# Patient Record
Sex: Male | Born: 1990 | Race: White | Hispanic: No | Marital: Single | Smoking: Current every day smoker
Health system: Southern US, Community
[De-identification: ages and names within clinical notes are randomized; demographics above are authoritative.]

---
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
## 2013-05-04 ENCOUNTER — Other Ambulatory Visit (HOSPITAL_COMMUNITY): Payer: Self-pay | Admitting: Orthopedic Surgery

## 2013-05-04 DIAGNOSIS — M545 Low back pain: Secondary | ICD-10-CM

## 2013-05-09 ENCOUNTER — Ambulatory Visit (HOSPITAL_COMMUNITY)
Admission: RE | Admit: 2013-05-09 | Discharge: 2013-05-09 | Disposition: A | Discharge status: Home / Self Care | Payer: BC Managed Care – PPO | Source: Ambulatory Visit | Attending: Orthopedic Surgery | Admitting: Orthopedic Surgery

## 2013-05-09 DIAGNOSIS — IMO0001 Reserved for inherently not codable concepts without codable children: Secondary | ICD-10-CM | POA: Insufficient documentation

## 2013-05-09 DIAGNOSIS — M545 Low back pain, unspecified: Secondary | ICD-10-CM | POA: Insufficient documentation

## 2013-05-09 DIAGNOSIS — M5126 Other intervertebral disc displacement, lumbar region: Secondary | ICD-10-CM | POA: Insufficient documentation

## 2013-05-09 DIAGNOSIS — M48061 Spinal stenosis, lumbar region without neurogenic claudication: Secondary | ICD-10-CM | POA: Insufficient documentation

## 2013-05-17 ENCOUNTER — Other Ambulatory Visit: Payer: Self-pay | Admitting: Orthopedic Surgery

## 2013-05-17 DIAGNOSIS — M549 Dorsalgia, unspecified: Secondary | ICD-10-CM

## 2013-05-25 ENCOUNTER — Ambulatory Visit
Admission: RE | Admit: 2013-05-25 | Discharge: 2013-05-25 | Disposition: A | Discharge status: Home / Self Care | Payer: BC Managed Care – PPO | Source: Ambulatory Visit | Attending: Orthopedic Surgery | Admitting: Orthopedic Surgery

## 2013-05-25 VITALS — BP 144/72 | HR 62

## 2013-05-25 DIAGNOSIS — M549 Dorsalgia, unspecified: Secondary | ICD-10-CM

## 2013-05-25 MED ORDER — METHYLPREDNISOLONE ACETATE 40 MG/ML INJ SUSP (RADIOLOG
120.0000 mg | Freq: Once | INTRAMUSCULAR | Status: AC
  Administered 2013-05-25: 120 mg via EPIDURAL

## 2013-05-25 MED ORDER — IOHEXOL 180 MG/ML  SOLN
1.0000 mL | Freq: Once | INTRAMUSCULAR | Status: AC | PRN
  Administered 2013-05-25: 1 mL via EPIDURAL

## 2017-10-20 DIAGNOSIS — K029 Dental caries, unspecified: Secondary | ICD-10-CM | POA: Insufficient documentation

## 2017-10-20 DIAGNOSIS — F17210 Nicotine dependence, cigarettes, uncomplicated: Secondary | ICD-10-CM | POA: Insufficient documentation

## 2017-10-21 ENCOUNTER — Encounter (HOSPITAL_COMMUNITY): Payer: Self-pay

## 2017-10-21 ENCOUNTER — Other Ambulatory Visit: Payer: Self-pay

## 2017-10-21 ENCOUNTER — Emergency Department (HOSPITAL_COMMUNITY)
Admission: EM | Admit: 2017-10-21 | Discharge: 2017-10-21 | Disposition: A | Discharge status: Home / Self Care | Payer: Self-pay | Attending: Emergency Medicine | Admitting: Emergency Medicine

## 2017-10-21 DIAGNOSIS — K029 Dental caries, unspecified: Secondary | ICD-10-CM

## 2017-10-21 MED ORDER — PENICILLIN V POTASSIUM 500 MG PO TABS: 500.0000 mg | ORAL_TABLET | Freq: Four times a day (QID) | ORAL | 0 refills | Status: AC

## 2017-10-21 MED ORDER — PENICILLIN V POTASSIUM 250 MG PO TABS
500.0000 mg | ORAL_TABLET | Freq: Once | ORAL | Status: AC
  Administered 2017-10-21: 500 mg via ORAL
  Filled 2017-10-21: qty 2

## 2017-10-21 MED ORDER — NAPROXEN 250 MG PO TABS
500.0000 mg | ORAL_TABLET | Freq: Once | ORAL | Status: AC
  Administered 2017-10-21: 500 mg via ORAL
  Filled 2017-10-21: qty 2

## 2017-10-21 NOTE — ED Provider Notes (Signed)
[facility] Provider Note   CSN: [id number] Arrival date & time: 10/20/17  2349     History   Chief Complaint Chief Complaint  Patient presents with  . Dental Pain    HPI Brad Tapia is a 27 y.o. male.  Patient states he has a history of "hereditary poor dentition".  Presents with a several day history of left-sided lower tooth pain over about the past 1 week.  Taking Tylenol without improvement.  Came in tonight because his wife made him.  Denies any bleeding or drainage.  Denies any difficulty breathing or difficulty swallowing.  No fever or vomiting. Patient feels that he might have an abscess.  He does not have a dentist.  Denies any other medical problems.   The history is provided by the patient.  Dental Pain      History reviewed. No pertinent past medical history.  There are no active problems to display for this patient.   History reviewed. No pertinent surgical history.     Home Medications    Prior to Admission medications   Not on File    Family History No family history on file.  Social History Social History   Tobacco Use  . Smoking status: Current Every Day Smoker    Packs/day: 1.00    Years: 4.00    Pack years: 4.00    Types: Cigarettes  . Smokeless tobacco: Never Used  Substance Use Topics  . Alcohol use: No    Frequency: Never  . Drug use: No     Allergies   Patient has no known allergies.   Review of Systems Review of Systems  Constitutional: Negative for appetite change and fever.  HENT: Positive for dental problem. Negative for drooling, sneezing, sore throat and trouble swallowing.   Eyes: Negative for visual disturbance.  Respiratory: Negative for cough and shortness of breath.   Cardiovascular: Negative for chest pain.  Gastrointestinal: Negative for abdominal pain, nausea and vomiting.  Genitourinary: Negative for dysuria and hematuria.  Musculoskeletal: Negative for arthralgias and myalgias.   Skin: Negative for rash.  Neurological: Negative for dizziness, weakness and headaches.   all other systems are negative except as noted in the HPI and PMH.     Physical Exam Updated Vital Signs BP (!) 124/98 (BP Location: Right Arm)   Pulse 67   Temp 97.9 F (36.6 C) (Oral)   Resp 14   Ht 5' 9.5" (1.765 m)   Wt 90.7 kg (200 lb)   SpO2 100%   BMI 29.11 kg/m   Physical Exam  Constitutional: He is oriented to person, place, and time. He appears well-developed and well-nourished. No distress.  HENT:  Head: Normocephalic and atraumatic.  Mouth/Throat: Oropharynx is clear and moist. No oropharyngeal exudate.  Very poor dentition throughout.  Multiple teeth broken off the gumline.  Floor of mouth is soft.  No Trismus. No abscess seen.  Tenderness to palpation of left lateral gumline without fluctuance or induration.  Floor of mouth is soft.  Eyes: Conjunctivae and EOM are normal. Pupils are equal, round, and reactive to light.  Neck: Normal range of motion. Neck supple.  No meningismus.  Cardiovascular: Normal rate, regular rhythm, normal heart sounds and intact distal pulses.  No murmur heard. Pulmonary/Chest: Effort normal and breath sounds normal. No respiratory distress.  Abdominal: Soft. There is no tenderness. There is no rebound and no guarding.  Musculoskeletal: Normal range of motion. He exhibits no edema or tenderness.  Neurological: He  is alert and oriented to person, place, and time. No cranial nerve deficit. He exhibits normal muscle tone. Coordination normal.  No ataxia on finger to nose bilaterally. No pronator drift. 5/5 strength throughout. CN 2-12 intact.Equal grip strength. Sensation intact.   Skin: Skin is warm.  Psychiatric: He has a normal mood and affect. His behavior is normal.  Nursing note and vitals reviewed.    ED Treatments / Results  Labs (all labs ordered are listed, but only abnormal results are displayed) Labs Reviewed - No data to  display  EKG  EKG Interpretation None       Radiology No results found.  Procedures Procedures (including critical care time)  Medications Ordered in ED Medications  penicillin v potassium (VEETID) tablet 500 mg (not administered)  naproxen (NAPROSYN) tablet 500 mg (not administered)     Initial Impression / Assessment and Plan / ED Course  I have reviewed the triage vital signs and the nursing notes.  Pertinent labs & imaging results that were available during my care of the patient were reviewed by me and considered in my medical decision making (see chart for details).     Patient with poor dentition throughout.  No evidence of abscess or Ludwig's angina.  Patient will be treated for multiple caries and needs dental follow-up.  Return precautions discussed    Final Clinical Impressions(s) / ED Diagnoses   Final diagnoses:  Dental caries    ED Discharge Orders    None       Madasyn Heath, Jeannett Senior, MD 10/21/17 [phone]

## 2017-10-21 NOTE — ED Triage Notes (Signed)
Left lower tooth pain with what he thinks is an abscess.

## 2019-01-11 ENCOUNTER — Emergency Department (HOSPITAL_COMMUNITY)
Admission: EM | Admit: 2019-01-11 | Discharge: 2019-01-12 | Disposition: A | Discharge status: Home / Self Care | Payer: Self-pay | Attending: Emergency Medicine | Admitting: Emergency Medicine

## 2019-01-11 ENCOUNTER — Other Ambulatory Visit: Payer: Self-pay

## 2019-01-11 ENCOUNTER — Encounter (HOSPITAL_COMMUNITY): Payer: Self-pay | Admitting: Emergency Medicine

## 2019-01-11 DIAGNOSIS — F17210 Nicotine dependence, cigarettes, uncomplicated: Secondary | ICD-10-CM | POA: Insufficient documentation

## 2019-01-11 DIAGNOSIS — H5789 Other specified disorders of eye and adnexa: Secondary | ICD-10-CM | POA: Insufficient documentation

## 2019-01-11 MED ORDER — ERYTHROMYCIN 5 MG/GM OP OINT
TOPICAL_OINTMENT | Freq: Once | OPHTHALMIC | Status: AC
  Administered 2019-01-12: 1 via OPHTHALMIC
  Filled 2019-01-11: qty 3.5

## 2019-01-11 MED ORDER — TETRACAINE HCL 0.5 % OP SOLN
1.0000 [drp] | Freq: Once | OPHTHALMIC | Status: AC
  Administered 2019-01-11: 1 [drp] via OPHTHALMIC
  Filled 2019-01-11: qty 4

## 2019-01-11 MED ORDER — FLUORESCEIN SODIUM 1 MG OP STRP
1.0000 | ORAL_STRIP | Freq: Once | OPHTHALMIC | Status: AC
  Administered 2019-01-11: 22:00:00 1 via OPHTHALMIC
  Filled 2019-01-11: qty 1
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
## 2019-01-11 NOTE — ED Triage Notes (Signed)
Patient complains of left eye irritation that started around 6 p.m. today. Patient states he was outside and feels like he may have something in his left eye. (dust) Eye is red and painful with burning and itching.

## 2019-02-28 DIAGNOSIS — N23 Unspecified renal colic: Secondary | ICD-10-CM | POA: Insufficient documentation

## 2019-02-28 DIAGNOSIS — N200 Calculus of kidney: Secondary | ICD-10-CM | POA: Insufficient documentation

## 2019-02-28 DIAGNOSIS — F17210 Nicotine dependence, cigarettes, uncomplicated: Secondary | ICD-10-CM | POA: Insufficient documentation

## 2019-03-01 ENCOUNTER — Emergency Department (HOSPITAL_COMMUNITY): Payer: Self-pay

## 2019-03-01 ENCOUNTER — Other Ambulatory Visit: Payer: Self-pay

## 2019-03-01 ENCOUNTER — Encounter (HOSPITAL_COMMUNITY): Payer: Self-pay

## 2019-03-01 ENCOUNTER — Emergency Department (HOSPITAL_COMMUNITY)
Admission: EM | Admit: 2019-03-01 | Discharge: 2019-03-01 | Disposition: A | Discharge status: Home / Self Care | Payer: Self-pay | Attending: Emergency Medicine | Admitting: Emergency Medicine

## 2019-03-01 DIAGNOSIS — N200 Calculus of kidney: Secondary | ICD-10-CM

## 2019-03-01 DIAGNOSIS — N23 Unspecified renal colic: Secondary | ICD-10-CM

## 2019-03-01 LAB — BASIC METABOLIC PANEL
Anion gap: 11 (ref 5–15)
BUN: 12 mg/dL (ref 6–20)
CO2: 24 mmol/L (ref 22–32)
Calcium: 9.1 mg/dL (ref 8.9–10.3)
Chloride: 105 mmol/L (ref 98–111)
Creatinine, Ser: 0.87 mg/dL (ref 0.61–1.24)
GFR calc Af Amer: >60 mL/min (ref >60)
GFR calc non Af Amer: >60 mL/min (ref >60)
Glucose, Bld: 128 mg/dL — ABNORMAL HIGH (ref 70–99)
Potassium: 4.4 mmol/L (ref 3.5–5.1)
Sodium: 140 mmol/L (ref 135–145)

## 2019-03-01 LAB — CBC WITH DIFFERENTIAL/PLATELET
Abs Immature Granulocytes: 0.07 10*3/uL (ref 0.00–0.07)
Basophils Absolute: 0 10*3/uL (ref 0.0–0.1)
Basophils Relative: 0 %
Eosinophils Absolute: 0 10*3/uL (ref 0.0–0.5)
Eosinophils Relative: 0 %
HCT: 46.2 % (ref 39.0–52.0)
Hemoglobin: 15.7 g/dL (ref 13.0–17.0)
Immature Granulocytes: 1 %
Lymphocytes Relative: 8 %
Lymphs Abs: 1.1 10*3/uL (ref 0.7–4.0)
MCH: 31.7 pg (ref 26.0–34.0)
MCHC: 34 g/dL (ref 30.0–36.0)
MCV: 93.1 fL (ref 80.0–100.0)
Monocytes Absolute: 0.4 10*3/uL (ref 0.1–1.0)
Monocytes Relative: 3 %
Neutro Abs: 11.8 10*3/uL — ABNORMAL HIGH (ref 1.7–7.7)
Neutrophils Relative %: 88 %
Platelets: 210 10*3/uL (ref 150–400)
RBC: 4.96 MIL/uL (ref 4.22–5.81)
RDW: 12.5 % (ref 11.5–15.5)
WBC: 13.4 10*3/uL — ABNORMAL HIGH (ref 4.0–10.5)
nRBC: 0 % (ref 0.0–0.2)

## 2019-03-01 LAB — URINALYSIS, ROUTINE W REFLEX MICROSCOPIC
Bacteria, UA: NONE SEEN
Bilirubin Urine: NEGATIVE
Glucose, UA: NEGATIVE mg/dL
Ketones, ur: NEGATIVE mg/dL
Leukocytes,Ua: NEGATIVE
Nitrite: NEGATIVE
Protein, ur: NEGATIVE mg/dL
RBC / HPF: >50 RBC/hpf — ABNORMAL HIGH (ref 0–5)
Specific Gravity, Urine: 1.021 (ref 1.005–1.030)
pH: 6 (ref 5.0–8.0)

## 2019-03-01 MED ORDER — IBUPROFEN 800 MG PO TABS: 800.0000 mg | ORAL_TABLET | Freq: Three times a day (TID) | ORAL | 0 refills | Status: AC

## 2019-03-01 MED ORDER — KETOROLAC TROMETHAMINE 30 MG/ML IJ SOLN
30.0000 mg | Freq: Once | INTRAMUSCULAR | Status: AC
  Administered 2019-03-01: 03:00:00 30 mg via INTRAVENOUS
  Filled 2019-03-01: qty 1

## 2019-03-01 MED ORDER — OXYCODONE-ACETAMINOPHEN 5-325 MG PO TABS: 1.0000 | ORAL_TABLET | ORAL | 0 refills | Status: AC | PRN

## 2019-03-01 MED ORDER — ONDANSETRON 4 MG PO TBDP: 4.0000 mg | ORAL_TABLET | Freq: Three times a day (TID) | ORAL | 0 refills | Status: AC | PRN

## 2019-03-01 MED ORDER — MORPHINE SULFATE (PF) 4 MG/ML IV SOLN
4.0000 mg | Freq: Once | INTRAVENOUS | Status: AC
Start: 2019-03-01 — End: 2019-03-01
  Administered 2019-03-01: 4 mg via INTRAVENOUS
  Filled 2019-03-01: qty 1

## 2019-03-01 MED ORDER — ONDANSETRON HCL 4 MG/2ML IJ SOLN
4.0000 mg | Freq: Once | INTRAMUSCULAR | Status: AC
  Administered 2019-03-01: 03:00:00 4 mg via INTRAVENOUS
  Filled 2019-03-01: qty 2

## 2019-03-01 NOTE — ED Triage Notes (Signed)
Pt arrived via POV c/o Rt Side Flank pain which began apprx 2000 last night. Pt states pain moves from back right side to front lower abdomen.

## 2019-03-01 NOTE — Discharge Instructions (Addendum)
Take the pain and nausea medication as prescribed.  Follow-up with the urologist.  Return to the ED with worsening pain, vomiting, inability urinate, fever or any other concerns.

## 2019-03-01 NOTE — ED Provider Notes (Signed)
[facility] Provider [facility]   CSN: [id number] Arrival date & time: 02/28/19  2344     History   Chief Complaint Chief Complaint  Patient presents with  . Flank Pain    HPI Brad Tapia is a 28 y.o. male.     Patient presents with right-sided flank pain and back pain that onset tonight around 8:00.  The pain radiates to his right abdomen and right testicle.  Is coming and going in waves.  He had one episode of vomiting at home.  He had some pain with urination or blood in the urine.  Denies any fevers or chills.  Denies any diarrhea.  Denies any chest pain or shortness of breath.  There is no radiation of the pain down his leg.  No bowel or bladder incontinence.  No fever.  He is concerned he could have a kidney stone.  He is never had one before.  No abdominal surgeries.  The history is provided by the patient.  Flank Pain Associated symptoms include abdominal pain. Pertinent negatives include no chest pain, no headaches and no shortness of breath.    History reviewed. No pertinent past medical history.  There are no active problems to display for this patient.   History reviewed. No pertinent surgical history.      Home Medications    Prior to Admission medications   Not on File    Family History History reviewed. No pertinent family history.  Social History Social History   Tobacco Use  . Smoking status: Current Every Day Smoker    Packs/day: 1.00    Years: 4.00    Pack years: 4.00    Types: Cigarettes  . Smokeless tobacco: Never Used  Substance Use Topics  . Alcohol use: No    Frequency: Never  . Drug use: No     Allergies   Patient has no known allergies.   Review of Systems Review of Systems  Constitutional: Negative for activity change, appetite change and fever.  HENT: Negative for congestion and rhinorrhea.   Respiratory: Negative for cough, chest tightness and shortness of breath.   Cardiovascular: Negative for  chest pain.  Gastrointestinal: Positive for abdominal pain, nausea and vomiting.  Genitourinary: Positive for dysuria, flank pain, hematuria, testicular pain and urgency.  Musculoskeletal: Positive for back pain. Negative for arthralgias and myalgias.  Skin: Negative for rash.  Neurological: Negative for dizziness, weakness and headaches.   all other systems are negative except as noted in the HPI and PMH.     Physical Exam Updated Vital Signs BP (!) 143/97 (BP Location: Left Arm)   Pulse 60   Temp 98.1 F (36.7 C) (Oral)   Resp 18   Ht 5\' 9"  (1.753 m)   Wt 99.8 kg   SpO2 100%   BMI 32.49 kg/m   Physical Exam Vitals signs and nursing note reviewed.  Constitutional:      General: He is not in acute distress.    Appearance: He is well-developed.     Comments: Resting comfortably  HENT:     Head: Normocephalic and atraumatic.     Mouth/Throat:     Pharynx: No oropharyngeal exudate.  Eyes:     Conjunctiva/sclera: Conjunctivae normal.     Pupils: Pupils are equal, round, and reactive to light.  Neck:     Musculoskeletal: Normal range of motion and neck supple.     Comments: No meningismus. Cardiovascular:     Rate and Rhythm: Normal rate  and regular rhythm.     Heart sounds: Normal heart sounds. No murmur.  Pulmonary:     Effort: Pulmonary effort is normal. No respiratory distress.     Breath sounds: Normal breath sounds.  Abdominal:     Palpations: Abdomen is soft.     Tenderness: There is no abdominal tenderness. There is no guarding or rebound.     Comments: Mild diffuse lower abdominal tenderness  Genitourinary:    Comments: No testicular tenderness Musculoskeletal: Normal range of motion.        General: No tenderness.     Comments: Right paraspinal CVA tenderness, no midline tenderness  Intact DP and PT pulses bilaterally  Skin:    General: Skin is warm.  Neurological:     Mental Status: He is alert and oriented to person, place, and time.     Cranial  Nerves: No cranial nerve deficit.     Motor: No abnormal muscle tone.     Coordination: Coordination normal.     Comments: No ataxia on finger to nose bilaterally. No pronator drift. 5/5 strength throughout. CN 2-12 intact.Equal grip strength. Sensation intact.   Psychiatric:        Behavior: Behavior normal.      ED Treatments / Results  Labs (all labs ordered are listed, but only abnormal results are displayed) Labs Reviewed  CBC WITH DIFFERENTIAL/PLATELET - Abnormal; Notable for the following components:      Result Value   WBC 13.4 (*)    Neutro Abs 11.8 (*)    All other components within normal limits  BASIC METABOLIC PANEL - Abnormal; Notable for the following components:   Glucose, Bld 128 (*)    All other components within normal limits  URINALYSIS, ROUTINE W REFLEX MICROSCOPIC - Abnormal; Notable for the following components:   APPearance HAZY (*)    Hgb urine dipstick LARGE (*)    RBC / HPF >50 (*)    All other components within normal limits    EKG    Radiology Ct Renal Stone Study  Result Date: 03/01/2019 CLINICAL DATA:  Right flank pain. EXAM: CT ABDOMEN AND PELVIS WITHOUT CONTRAST TECHNIQUE: Multidetector CT imaging of the abdomen and pelvis was performed following the standard protocol without IV contrast. COMPARISON:  None. FINDINGS: Lower chest: Breathing motion artifact. No focal airspace disease. No pleural fluid. Hepatobiliary: Decreased hepatic density consistent with steatosis. Focal fatty sparing adjacent to the gallbladder fossa. No focal hepatic abnormality on noncontrast exam. Pancreas: No ductal dilatation or inflammation. Spleen: Normal in size. Slight lobular contours superiorly. Adrenals/Urinary Tract: Normal adrenal glands. Obstructing 4 mm stone in the distal right ureter (at the level of the mid sacrum) with mild proximal hydroureteronephrosis. Minimal right perinephric edema. No additional nonobstructing right renal stones. There is a punctate  nonobstructing stone in the mid left kidney. No left hydronephrosis. Urinary bladder is partially distended. No bladder stone. Stomach/Bowel: Stomach is within normal limits. Appendix appears normal. No evidence of bowel wall thickening, distention, or inflammatory changes. Vascular/Lymphatic: A few prominent upper retroperitoneal nodes are likely reactive. Mild retroperitoneal edema in the upper abdomen likely reactive related to obstructive uropathy. Normal caliber abdominal aorta. No enlarged pelvic lymph nodes. Reproductive: Prostate is unremarkable. Other: Tiny fat containing umbilical hernia. No free air, free fluid, or intra-abdominal fluid collection. Musculoskeletal: There are no acute or suspicious osseous abnormalities. IMPRESSION: 1. Obstructing 4 mm stone in the distal right ureter with mild hydroureteronephrosis. 2. Punctate nonobstructing left nephrolithiasis. 3. Hepatic steatosis. Electronically Signed  By: Keith Rake M.D.   On: 03/01/2019 03:24    Procedures Procedures (including critical care time)  Medications Ordered in ED Medications  morphine 4 MG/ML injection 4 mg (has no administration in time range)  ondansetron (ZOFRAN) injection 4 mg (has no administration in time range)  ketorolac (TORADOL) 30 MG/ML injection 30 mg (has no administration in time range)     Initial Impression / Assessment and Plan / ED Course  I have reviewed the triage vital signs and the nursing notes.  Pertinent labs & imaging results that were available during my care of the patient were reviewed by me and considered in my medical decision making (see chart for details).       Flank pain with concern for possible kidney stone.  No fever. Abdomen soft and nontender. Cr normal.  UA positive for blood, no infection.  Leukocytosis noted.  CT scan confirms a 4 mm right-sided mid ureteral stone with obstruction.  His pain remains controlled.  He is able to urinate. No vomiting throughout  ED course.  Discussed with patient supportive care and follow-up with urology.  Return precautions discussed including worsening pain, vomiting, fever, inability urinate or other concerns.  Final Clinical Impressions(s) / ED Diagnoses   Final diagnoses:  Kidney stone  Ureteral colic    ED Discharge Orders    None       Latanja Lehenbauer, Annie Main, MD 03/01/19 [phone]

## 2019-03-01 NOTE — ED Notes (Signed)
Patient transported to CT 

## 2019-07-10 ENCOUNTER — Other Ambulatory Visit: Payer: Self-pay | Admitting: *Deleted

## 2019-07-10 DIAGNOSIS — Z20822 Contact with and (suspected) exposure to covid-19: Secondary | ICD-10-CM

## 2019-07-12 ENCOUNTER — Telehealth: Payer: Self-pay | Admitting: General Practice

## 2019-07-12 LAB — NOVEL CORONAVIRUS, NAA: SARS-CoV-2, NAA: NOT DETECTED

## 2019-07-12 NOTE — Telephone Encounter (Signed)
Negative COVID results given. Patient results "NOT Detected." Caller expressed understanding. ° °

## 2020-05-26 IMAGING — CT CT RENAL STONE PROTOCOL
2 of 3 series · 16 of 46 positions shown, 18 images · non-contrast
Comparison: None.

CLINICAL DATA: Right flank pain.

EXAM:
CT ABDOMEN AND PELVIS WITHOUT CONTRAST
TECHNIQUE: Multidetector CT imaging of the abdomen and pelvis was performed
following the standard protocol without IV contrast.

[Series 2: axial st · axial · 0.92mm/px · z∈[-379,+26]mm · 13 of 94 slices shown, 15 images]
[im 7/94  soft-tissue]
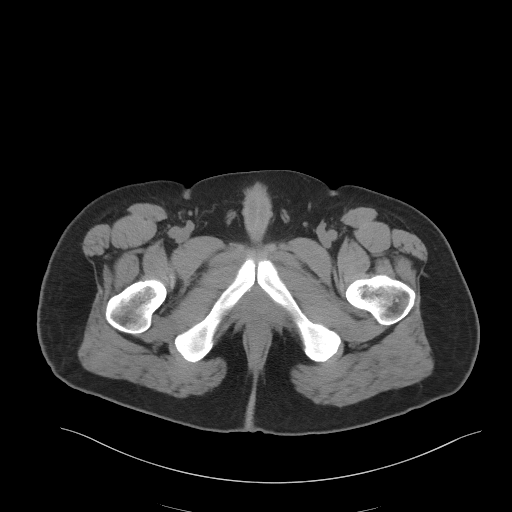
[im 7/94  bone]
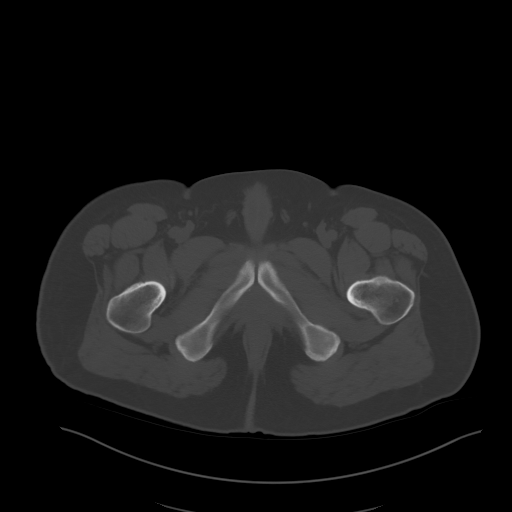
[im 13/94  soft-tissue]
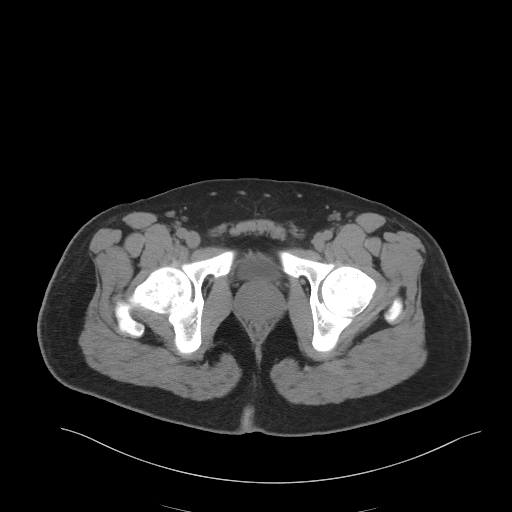
[im 19/94  soft-tissue]
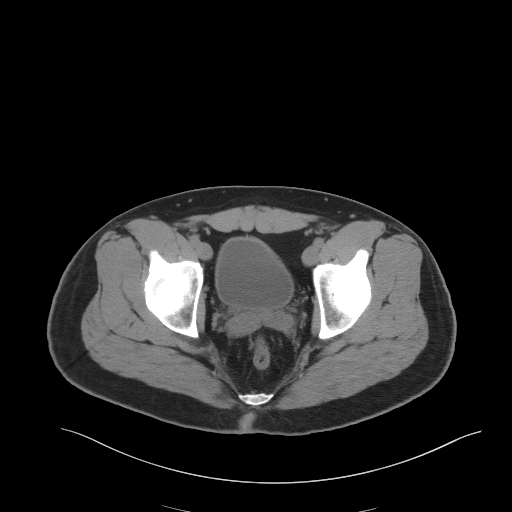
[im 28/94  soft-tissue]
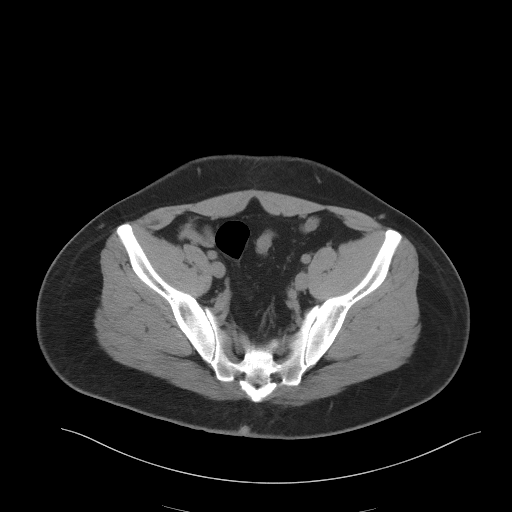
[im 34/94  soft-tissue]
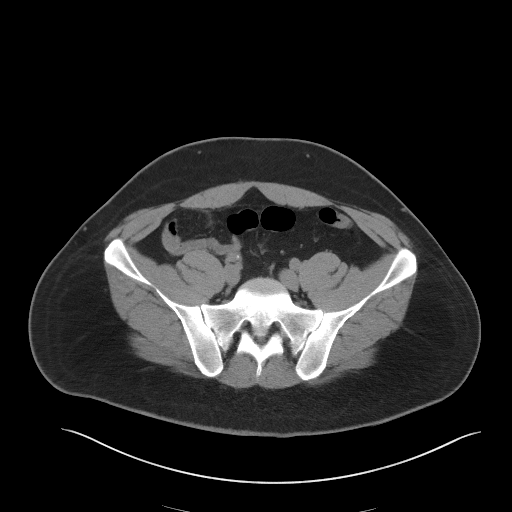
[im 40/94  soft-tissue]
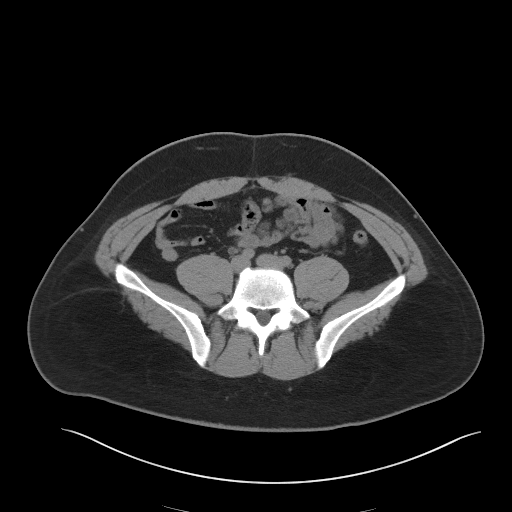
[im 49/94  soft-tissue]
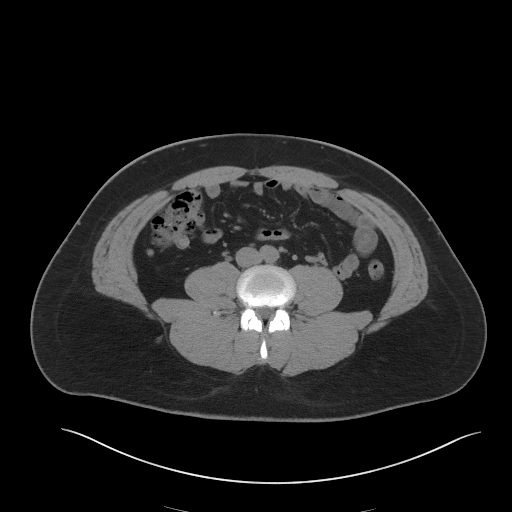
[im 55/94  soft-tissue]
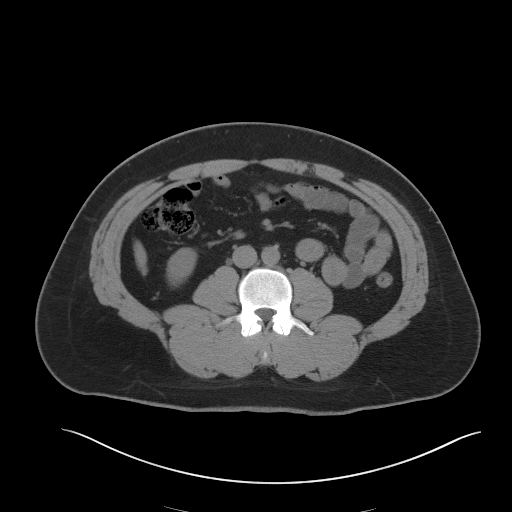
[im 61/94  soft-tissue]
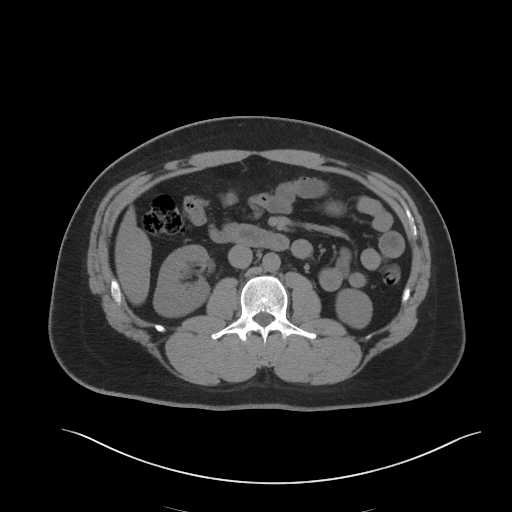
[im 61/94  bone]
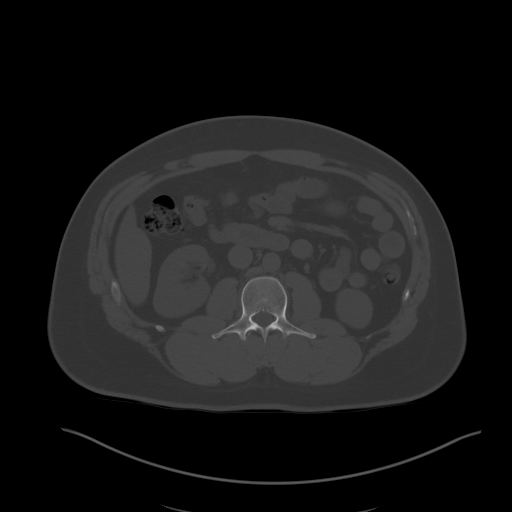
[im 67/94  soft-tissue]
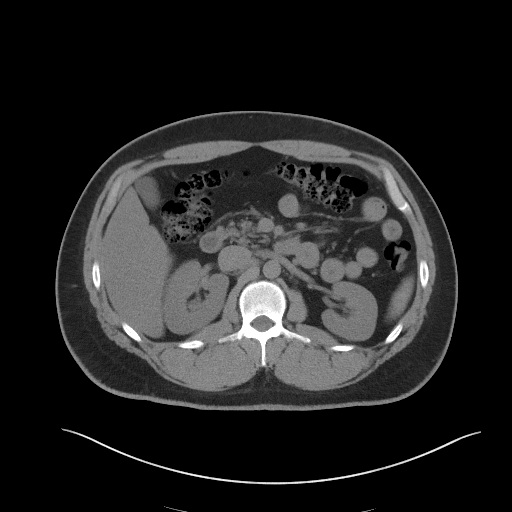
[im 76/94  soft-tissue]
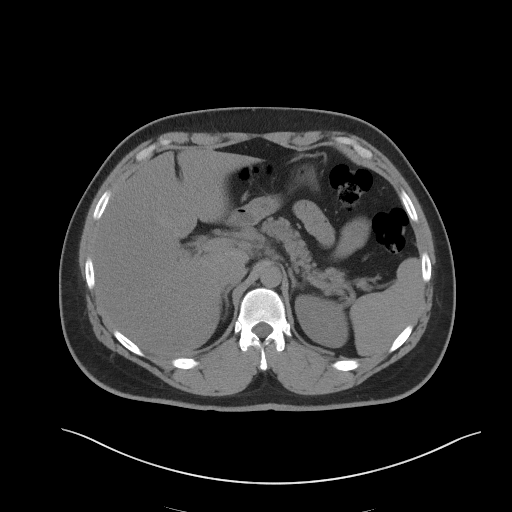
[im 82/94  soft-tissue]
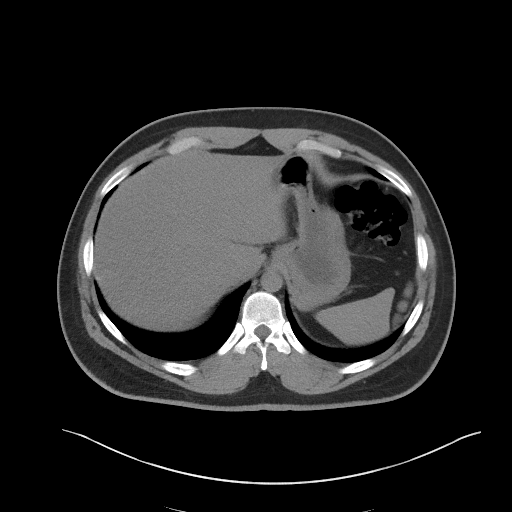
[im 88/94  soft-tissue]
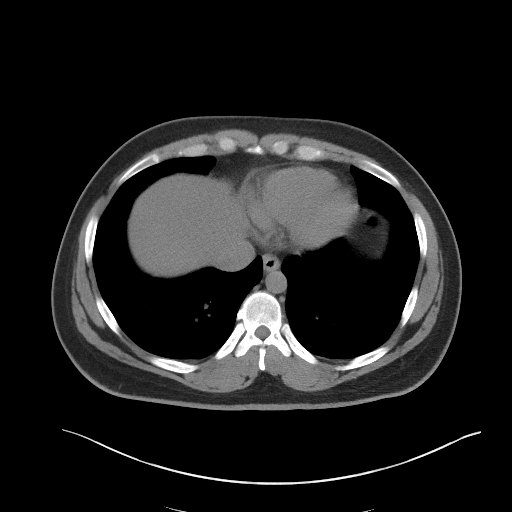

[Series 5: coronal st · coronal · 0.78mm/px · 3 of 87 slices shown]
[im 29/87  soft-tissue]
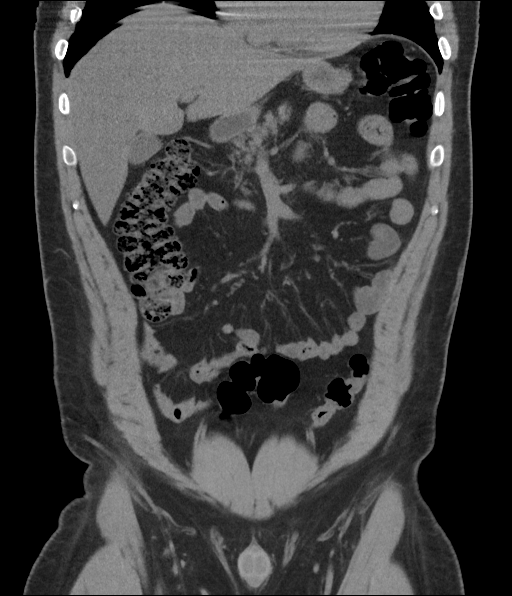
[im 39/87  soft-tissue]
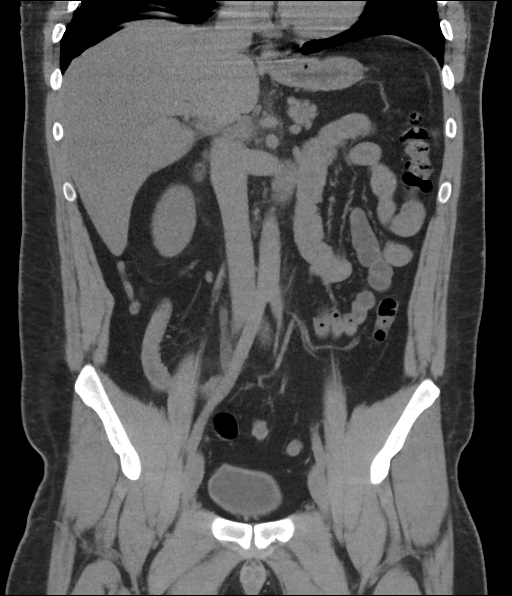
[im 48/87  soft-tissue]
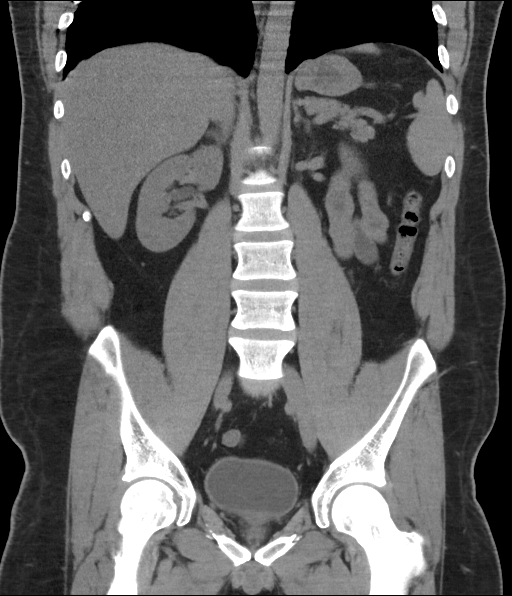

[16 of 46 positions shown; findings below may reference images not displayed]

FINDINGS: Lower chest: Breathing motion artifact. No focal airspace disease.
No pleural fluid.

Hepatobiliary: Decreased hepatic density consistent with steatosis.
Focal fatty sparing adjacent to the gallbladder fossa. No focal
hepatic abnormality on noncontrast exam.

Pancreas: No ductal dilatation or inflammation.

Spleen: Normal in size. Slight lobular contours superiorly.

Adrenals/Urinary Tract: Normal adrenal glands. Obstructing 4 mm
stone in the distal right ureter (at the level of the mid sacrum)
with mild proximal hydroureteronephrosis. Minimal right perinephric
edema. No additional nonobstructing right renal stones. There is a
punctate nonobstructing stone in the mid left kidney. No left
hydronephrosis. Urinary bladder is partially distended. No bladder
stone.

Stomach/Bowel: Stomach is within normal limits. Appendix appears
normal. No evidence of bowel wall thickening, distention, or
inflammatory changes.

Vascular/Lymphatic: A few prominent upper retroperitoneal nodes are
likely reactive. Mild retroperitoneal edema in the upper abdomen
likely reactive related to obstructive uropathy. Normal caliber
abdominal aorta. No enlarged pelvic lymph nodes.

Reproductive: Prostate is unremarkable.

Other: Tiny fat containing umbilical hernia. No free air, free
fluid, or intra-abdominal fluid collection.

Musculoskeletal: There are no acute or suspicious osseous
abnormalities.
IMPRESSION: 1. Obstructing 4 mm stone in the distal right ureter with mild
hydroureteronephrosis.
2. Punctate nonobstructing left nephrolithiasis.
3. Hepatic steatosis.
